# Patient Record
Sex: Male | Born: 1990 | Race: Black or African American | Hispanic: No | Marital: Single | Smoking: Current every day smoker
Health system: Southern US, Community
[De-identification: ages and names within clinical notes are randomized; demographics above are authoritative.]

---

## 2016-04-01 DIAGNOSIS — Y9301 Activity, walking, marching and hiking: Secondary | ICD-10-CM | POA: Insufficient documentation

## 2016-04-01 DIAGNOSIS — Y999 Unspecified external cause status: Secondary | ICD-10-CM | POA: Insufficient documentation

## 2016-04-01 DIAGNOSIS — S63266A Dislocation of metacarpophalangeal joint of right little finger, initial encounter: Secondary | ICD-10-CM | POA: Insufficient documentation

## 2016-04-01 DIAGNOSIS — Y929 Unspecified place or not applicable: Secondary | ICD-10-CM | POA: Insufficient documentation

## 2016-04-01 DIAGNOSIS — S63054A Dislocation of other carpometacarpal joint of right hand, initial encounter: Secondary | ICD-10-CM | POA: Insufficient documentation

## 2016-04-01 DIAGNOSIS — F17200 Nicotine dependence, unspecified, uncomplicated: Secondary | ICD-10-CM | POA: Insufficient documentation

## 2016-04-01 DIAGNOSIS — W1839XA Other fall on same level, initial encounter: Secondary | ICD-10-CM | POA: Insufficient documentation

## 2016-04-02 ENCOUNTER — Encounter (HOSPITAL_COMMUNITY): Payer: Self-pay | Admitting: Emergency Medicine

## 2016-04-02 ENCOUNTER — Emergency Department (HOSPITAL_COMMUNITY): Payer: Self-pay

## 2016-04-02 ENCOUNTER — Emergency Department (HOSPITAL_COMMUNITY)
Admission: EM | Admit: 2016-04-02 | Discharge: 2016-04-02 | Disposition: A | Discharge status: Home / Self Care | Payer: Self-pay | Attending: Emergency Medicine | Admitting: Emergency Medicine

## 2016-04-02 DIAGNOSIS — S63064A Dislocation of metacarpal (bone), proximal end of right hand, initial encounter: Secondary | ICD-10-CM

## 2016-04-02 MED ORDER — HYDROMORPHONE HCL 1 MG/ML IJ SOLN
1.0000 mg | Freq: Once | INTRAMUSCULAR | Status: AC
  Administered 2016-04-02: 1 mg via INTRAVENOUS
  Filled 2016-04-02: qty 1

## 2016-04-02 MED ORDER — IBUPROFEN 600 MG PO TABS: 600.0000 mg | ORAL_TABLET | Freq: Four times a day (QID) | ORAL | 0 refills | Status: AC | PRN

## 2016-04-02 MED ORDER — HYDROCODONE-ACETAMINOPHEN 5-325 MG PO TABS: 1.0000 | ORAL_TABLET | ORAL | 0 refills | Status: AC | PRN

## 2016-04-02 NOTE — ED Notes (Signed)
Providers in room procedure done

## 2016-04-02 NOTE — ED Triage Notes (Signed)
Pt states he fell while walking tonight, deformity to R hand noted, decreased cap refill to 4th and 5th fingers. Pt denies decreased sensation

## 2016-04-02 NOTE — Progress Notes (Signed)
Orthopedic Tech Progress Note Patient Details:  Alfonse SpruceDewayne Osgood [birth year] [id number]  Ortho Devices Type of Ortho Device: Ace wrap, Ulna gutter splint Ortho Device/Splint Location: Well padded plaster ulnar gutter splint Rt arm Ortho Device/Splint Interventions: Application, Minda MeoOrdered   Rola Lennon S Iam Lipson 04/02/2016, 4:58 AM

## 2016-04-02 NOTE — ED Provider Notes (Signed)
[facility]-EMERGENCY DEPT Provider Note   CSN: [id number] Arrival date & time: 04/01/16  2357  By signing my name below, I, Aggie MoatsJenny Song, attest that this documentation has been prepared under the direction and in the presence of Persephone Schriever A Makilah Dowda PA-C. Electronically signed by: Aggie MoatsJenny Song, ED Scribe. 04/02/16. 1:28 AM.  History   Chief Complaint Chief Complaint  Patient presents with  . Hand Injury   The history is provided by the patient. No language interpreter was used.   HPI Comments:  Jesse Weber is a 25 y.o. male who presents to the Emergency Department complaining of right hand injury, which occurred 2 hours ago. Pt reports that he fell onto his right hand while walking. Associated symptoms include pain to 4th and 5th fingers and swelling. He has not taken any medication prior to arrival. Denies decreased sensations or numbness. No pertinent past medical history or allergies.  History reviewed. No pertinent past medical history.  There are no active problems to display for this patient.   History reviewed. No pertinent surgical history.     Home Medications    Prior to Admission medications   Not on File    Family History History reviewed. No pertinent family history.  Social History Social History  Substance Use Topics  . Smoking status: Current Every Day Smoker  . Smokeless tobacco: Never Used  . Alcohol use Yes     Allergies   Review of patient's allergies indicates not on file.   Review of Systems Review of Systems  Musculoskeletal: Positive for arthralgias, joint swelling and myalgias.  Neurological: Negative for numbness.     Physical Exam Updated Vital Signs BP 139/79 (BP Location: Left Arm)   Pulse 92   Temp 98.6 F (37 C) (Oral)   Resp 12   SpO2 100%   Physical Exam  Constitutional: He is oriented to person, place, and time. He appears well-developed and well-nourished.  HENT:  Head: Normocephalic.  Eyes: EOM are normal.  Neck: Normal  range of motion.  Cardiovascular: Normal rate.   Pulmonary/Chest: Effort normal.  Musculoskeletal: Normal range of motion.  Right hand bony deformity of proximal dorsum of hand at 4th and 5th MCs. Closed injury. FROM all digits. Cap RF <2s distal to injury. Distal forearm non-tender.   Neurological: He is alert and oriented to person, place, and time.  Skin: Skin is warm and dry.  Psychiatric: He has a normal mood and affect.  Nursing note and vitals reviewed.    ED Treatments / Results  DIAGNOSTIC STUDIES:  Oxygen Saturation is 100% on room air, normal by my interpretation.    COORDINATION OF CARE:  1:25 AM Discussed treatment plan with pt at bedside, which includes IV pain medication and possible relocation, and pt agreed to plan.  Labs (all labs ordered are listed, but only abnormal results are displayed) Labs Reviewed - No data to display  EKG  EKG Interpretation None       Radiology No results found.  Procedures Procedures (including critical care time) Reduction of dislocated 5th carpometacarpal joint performed by Dr. Jeraldine LootsLockwood. Medications Ordered in ED Medications - No data to display   Initial Impression / Assessment and Plan / ED Course  I have reviewed the triage vital signs and the nursing notes.  Pertinent labs & imaging results that were available during my care of the patient were reviewed by me and considered in my medical decision making (see chart for details).  Clinical Course  Value Comment By Time  DG Hand Complete Right (Reviewed) Gerhard Munch, MD 09/02 0325    1. Carpometacarpal dislocation (5th right), successful reduction  Right hand dominant patient with dislocation injury of right hand and successful reduction. Refer to hand for follow up re-evaluation.   Final Clinical Impressions(s) / ED Diagnoses   Final diagnoses:  None    New Prescriptions New Prescriptions   No medications on file  I personally performed the services  described in this documentation, which was scribed in my presence. The recorded information has been reviewed and is accurate.      Elpidio Anis, PA-C 04/07/16 2142    Gerhard Munch, MD 04/18/16 [phone]

## 2016-04-02 NOTE — ED Notes (Signed)
PA at bedside.

## 2016-04-02 NOTE — ED Notes (Signed)
Pt transported to XR.  

## 2016-04-04 ENCOUNTER — Telehealth: Payer: Self-pay | Admitting: *Deleted

## 2016-07-20 ENCOUNTER — Emergency Department (HOSPITAL_COMMUNITY)
Admission: EM | Admit: 2016-07-20 | Discharge: 2016-07-21 | Disposition: A | Discharge status: Home / Self Care | Payer: Self-pay | Attending: Emergency Medicine | Admitting: Emergency Medicine

## 2016-07-20 ENCOUNTER — Encounter (HOSPITAL_COMMUNITY): Payer: Self-pay

## 2016-07-20 DIAGNOSIS — Z79899 Other long term (current) drug therapy: Secondary | ICD-10-CM | POA: Insufficient documentation

## 2016-07-20 DIAGNOSIS — H1031 Unspecified acute conjunctivitis, right eye: Secondary | ICD-10-CM

## 2016-07-20 DIAGNOSIS — H1089 Other conjunctivitis: Secondary | ICD-10-CM | POA: Insufficient documentation

## 2016-07-20 DIAGNOSIS — F17200 Nicotine dependence, unspecified, uncomplicated: Secondary | ICD-10-CM | POA: Insufficient documentation

## 2016-07-20 MED ORDER — TETRACAINE HCL 0.5 % OP SOLN
2.0000 [drp] | Freq: Once | OPHTHALMIC | Status: DC
  Filled 2016-07-20: qty 4

## 2016-07-20 MED ORDER — TETRACAINE HCL 0.5 % OP SOLN
2.0000 [drp] | Freq: Once | OPHTHALMIC | Status: AC
  Administered 2016-07-20: 2 [drp] via OPHTHALMIC

## 2016-07-20 MED ORDER — TETRACAINE HCL 0.5 % OP SOLN: 2.0000 [drp] | Freq: Once | OPHTHALMIC | Status: DC

## 2016-07-20 MED ORDER — POLYMYXIN B-TRIMETHOPRIM 10000-0.1 UNIT/ML-% OP SOLN: 1.0000 [drp] | OPHTHALMIC | 0 refills | Status: AC

## 2016-07-20 MED ORDER — FLUORESCEIN SODIUM 0.6 MG OP STRP
ORAL_STRIP | OPHTHALMIC | Status: AC
  Filled 2016-07-20: qty 1

## 2016-07-20 MED ORDER — FLUORESCEIN SODIUM 0.6 MG OP STRP: 1.0000 | ORAL_STRIP | Freq: Once | OPHTHALMIC | Status: DC

## 2016-07-20 NOTE — Discharge Instructions (Signed)
Your symptoms are most consistent with bacterial conjunctivitis.  You will be treated with antibiotic eye drops, please see attached prescription and apply drops to BOTH eyes as instructed.   Please read attached information on "bacterial conjunctivitis"  You have been given a work note, please see attached.   [facility] to establish care with a primary care provider.  This clinic takes patients without health insurance.   Return to emergency department if you experience changes in vision, fevers, swelling and redness in skin around your eye

## 2016-07-20 NOTE — ED Triage Notes (Signed)
Pt states that his R eye began getting red and itchy on Monday. Also states that he has experienced green drainage from same eye. Denies N/V/F. States that someone in his household just got over pink eye. A&Ox4.

## 2016-07-21 NOTE — ED Notes (Signed)
Patient d/c'd self care.  F/U and medications reviewed.  Patient verbalized understanding. 

## 2016-07-21 NOTE — ED Provider Notes (Signed)
[facility]-EMER[facility]ENCY DEPT Provider Note   CSN: [id number] Arrival date & time: 07/20/16  2147     History   Chief Complaint Chief Complaint  Patient presents with  . Conjunctivitis    HPI Jesse Weber is a 25 y.o. male presents with R eye pain, redness, green/yellow discharge x 3 days.  Pt denies contact lenses, trauma, changes in vision, pain with eye movements, fevers.  Pt states a relative was diagnosed with pink eye last week.  Pt has used relative's erythromycin ointment in his eye without relief.   HPI  History reviewed. No pertinent past medical history.  There are no active problems to display for this patient.   History reviewed. No pertinent surgical history.     Home Medications    Prior to Admission medications   Medication Sig Start Date End Date Taking? Authorizing Provider  HYDROcodone-acetaminophen (NORCO/VICODIN) 5-325 MG tablet Take 1-2 tablets by mouth every 4 (four) hours as needed. 04/02/16   Elpidio AnisShari Upstill, PA-C  ibuprofen (ADVIL,MOTRIN) 600 MG tablet Take 1 tablet (600 mg total) by mouth every 6 (six) hours as needed. 04/02/16   Elpidio AnisShari Upstill, PA-C  trimethoprim-polymyxin b (POLYTRIM) ophthalmic solution Place 1 drop into both eyes every 4 (four) hours. 07/20/16   Liberty Handylaudia J Gibbons, PA-C    Family History History reviewed. No pertinent family history.  Social History Social History  Substance Use Topics  . Smoking status: Current Every Day Smoker  . Smokeless tobacco: Never Used  . Alcohol use Yes     Allergies   Patient has no known allergies.   Review of Systems Review of Systems  Constitutional: Negative for chills and fever.  HENT: Negative for congestion, ear pain and sore throat.   Eyes: Positive for pain, discharge and redness. Negative for photophobia, itching and visual disturbance.  Respiratory: Negative for cough and shortness of breath.   Cardiovascular: Negative for chest pain and palpitations.  Gastrointestinal: Negative for  abdominal pain, nausea and vomiting.  Genitourinary: Negative for difficulty urinating.  Musculoskeletal: Negative for back pain.  Skin: Negative for color change and rash.  Neurological: Negative for headaches.  All other systems reviewed and are negative.    Physical Exam Updated Vital Signs BP 126/79 (BP Location: Left Arm)   Pulse 76   Temp 98.2 F (36.8 C) (Oral)   Resp 17   Ht 5\' 10"  (1.778 m)   SpO2 99%   Physical Exam  Constitutional: He is oriented to person, place, and time. Vital signs are normal. He appears well-developed and well-nourished.  HENT:  Head: Normocephalic and atraumatic.  TMs pearly gray without bulging or erythema. Oropharynx clear, moist without exudates.    Eyes:  R conjunctival injection with yellow discharge. No eyelash matting.  No chalazion or hordeolum visualized.  Fluorescein dye test negative for corneal abrasions in R eye. PERRL.  EOM normal and symmetric bilaterally without tenderness. No periorbital soft tissue edema, erythema or tenderness.   Neck: Normal range of motion. Neck supple.  Cardiovascular: Normal rate and regular rhythm.   Pulmonary/Chest: Effort normal and breath sounds normal.  Musculoskeletal: Normal range of motion.  Lymphadenopathy:    He has no cervical adenopathy.  Neurological: He is alert and oriented to person, place, and time.  Skin: Skin is warm and dry.  Psychiatric: He has a normal mood and affect. His behavior is normal.  Nursing note and vitals reviewed.   ED Treatments / Results  Labs (all labs ordered are listed, but only abnormal results  are displayed) Labs Reviewed - No data to display  EKG  EKG Interpretation None       Radiology No results found.  Procedures Procedures (including critical care time)  Medications Ordered in ED Medications  tetracaine (PONTOCAINE) 0.5 % ophthalmic solution 2 drop (2 drops Right Eye Given by Other 07/20/16 2353)     Initial Impression / Assessment and  Plan / ED Course  I have reviewed the triage vital signs and the nursing notes.  Pertinent labs & imaging results that were available during my care of the patient were reviewed by me and considered in my medical decision making (see chart for details).  Clinical Course    25 yo male with symptoms, relative diagnosed with pink eye last week, and eye exam findings most c/w bacterial conjunctivitis.  No abrasion noted on fluorescein dye test.  Doubt other eye emergency including acute angle glaucoma, periorbital cellulitis, iritis.  Pt instructed to stop using relative's antibiotic ointment.  Pt will be discharged with antibiotic eye drops and tylenol/ibuprofen for pain.  No need for ophtho referral at this point.  Strict ED return instructions given, pt verbalized understanding and agreeable to dispo plan.    Final Clinical Impressions(s) / ED Diagnoses   Final diagnoses:  Acute bacterial conjunctivitis of right eye    New Prescriptions Discharge Medication List as of 07/20/2016 11:44 PM    START taking these medications   Details  trimethoprim-polymyxin b (POLYTRIM) ophthalmic solution Place 1 drop into both eyes every 4 (four) hours., Starting Wed 07/20/2016, Print         Liberty HandyClaudia J Gibbons, PA-C 07/21/16 1127    Melene Planan Floyd, DO 07/21/16 1611

## 2016-07-22 DIAGNOSIS — Z5321 Procedure and treatment not carried out due to patient leaving prior to being seen by health care provider: Secondary | ICD-10-CM | POA: Insufficient documentation

## 2016-07-22 DIAGNOSIS — F17200 Nicotine dependence, unspecified, uncomplicated: Secondary | ICD-10-CM | POA: Insufficient documentation

## 2016-07-22 DIAGNOSIS — H5710 Ocular pain, unspecified eye: Secondary | ICD-10-CM | POA: Insufficient documentation

## 2016-07-23 ENCOUNTER — Emergency Department (HOSPITAL_COMMUNITY)
Admission: EM | Admit: 2016-07-23 | Discharge: 2016-07-23 | Disposition: A | Discharge status: Home / Self Care | Payer: Medicaid Other | Attending: Dermatology | Admitting: Dermatology

## 2016-07-23 NOTE — ED Notes (Signed)
Patient did not answer.  

## 2016-07-23 NOTE — ED Triage Notes (Signed)
Patient was here two days ago for pink eye. Patient still feels like it is still something in his eye. His states that it is painful.

## 2017-04-23 IMAGING — CR DG HAND COMPLETE 3+V*R*
3 series · 3 of 3 positions shown · non-contrast
Comparison: None.

CLINICAL DATA: Initial evaluation for acute trauma, fall. Pain at
fourth and fifth metacarpals.

EXAM:
RIGHT HAND - COMPLETE 3+ VIEW

[x hand pa right]
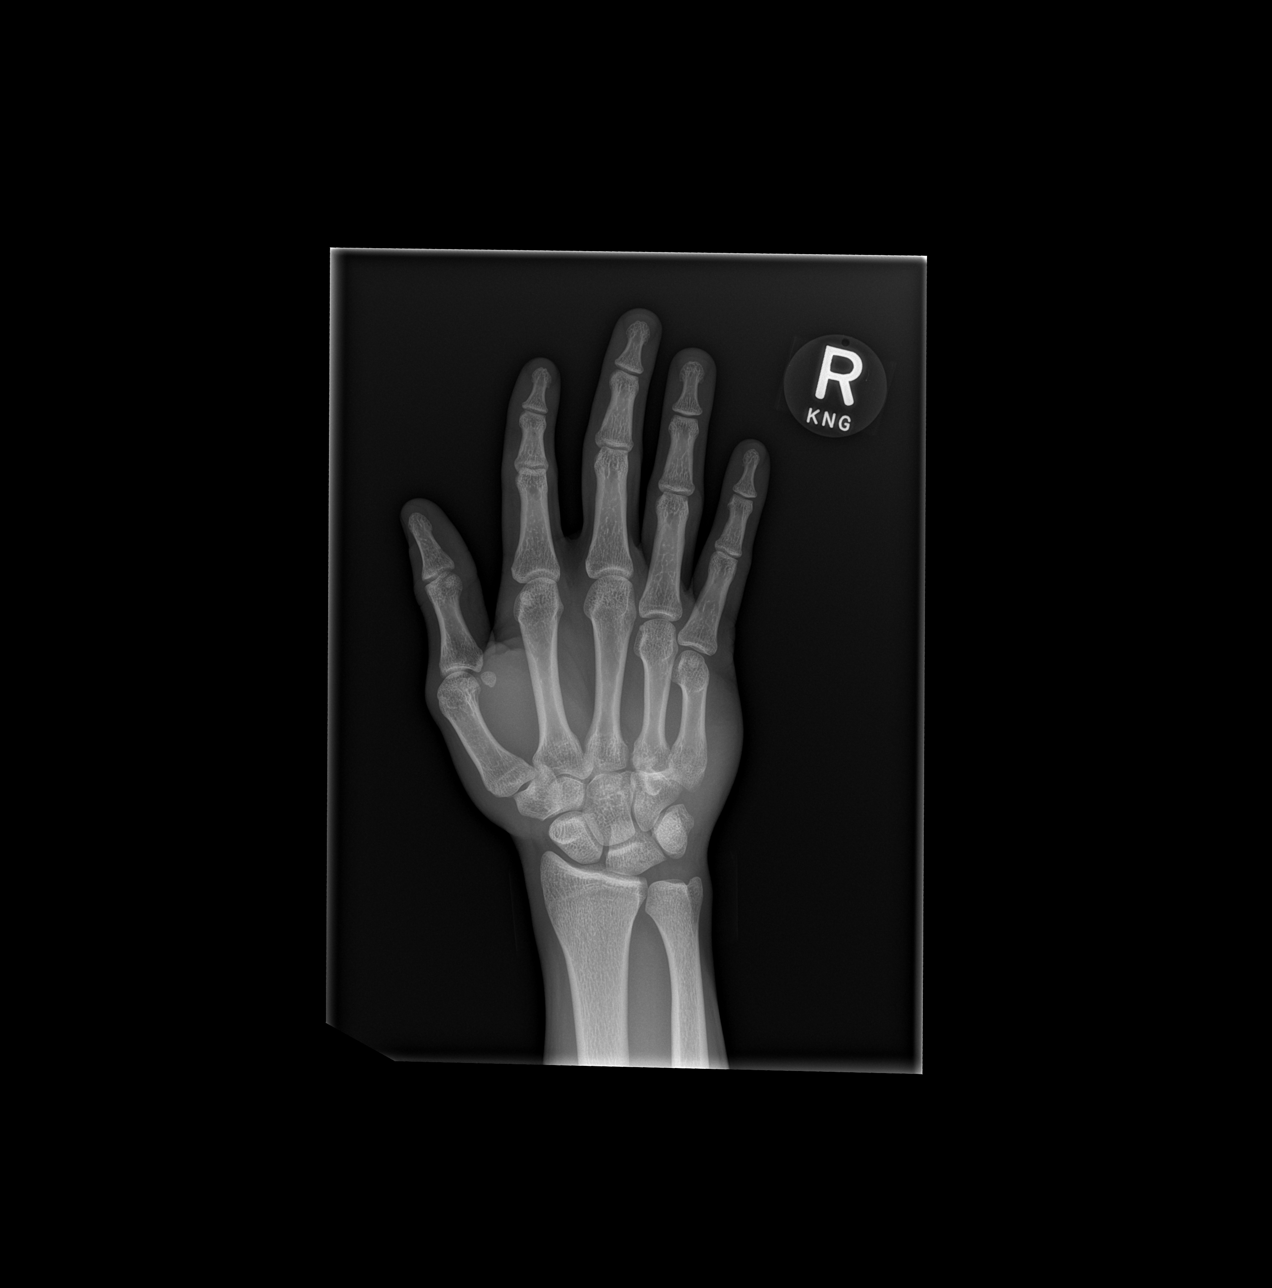

[x hand obl right]
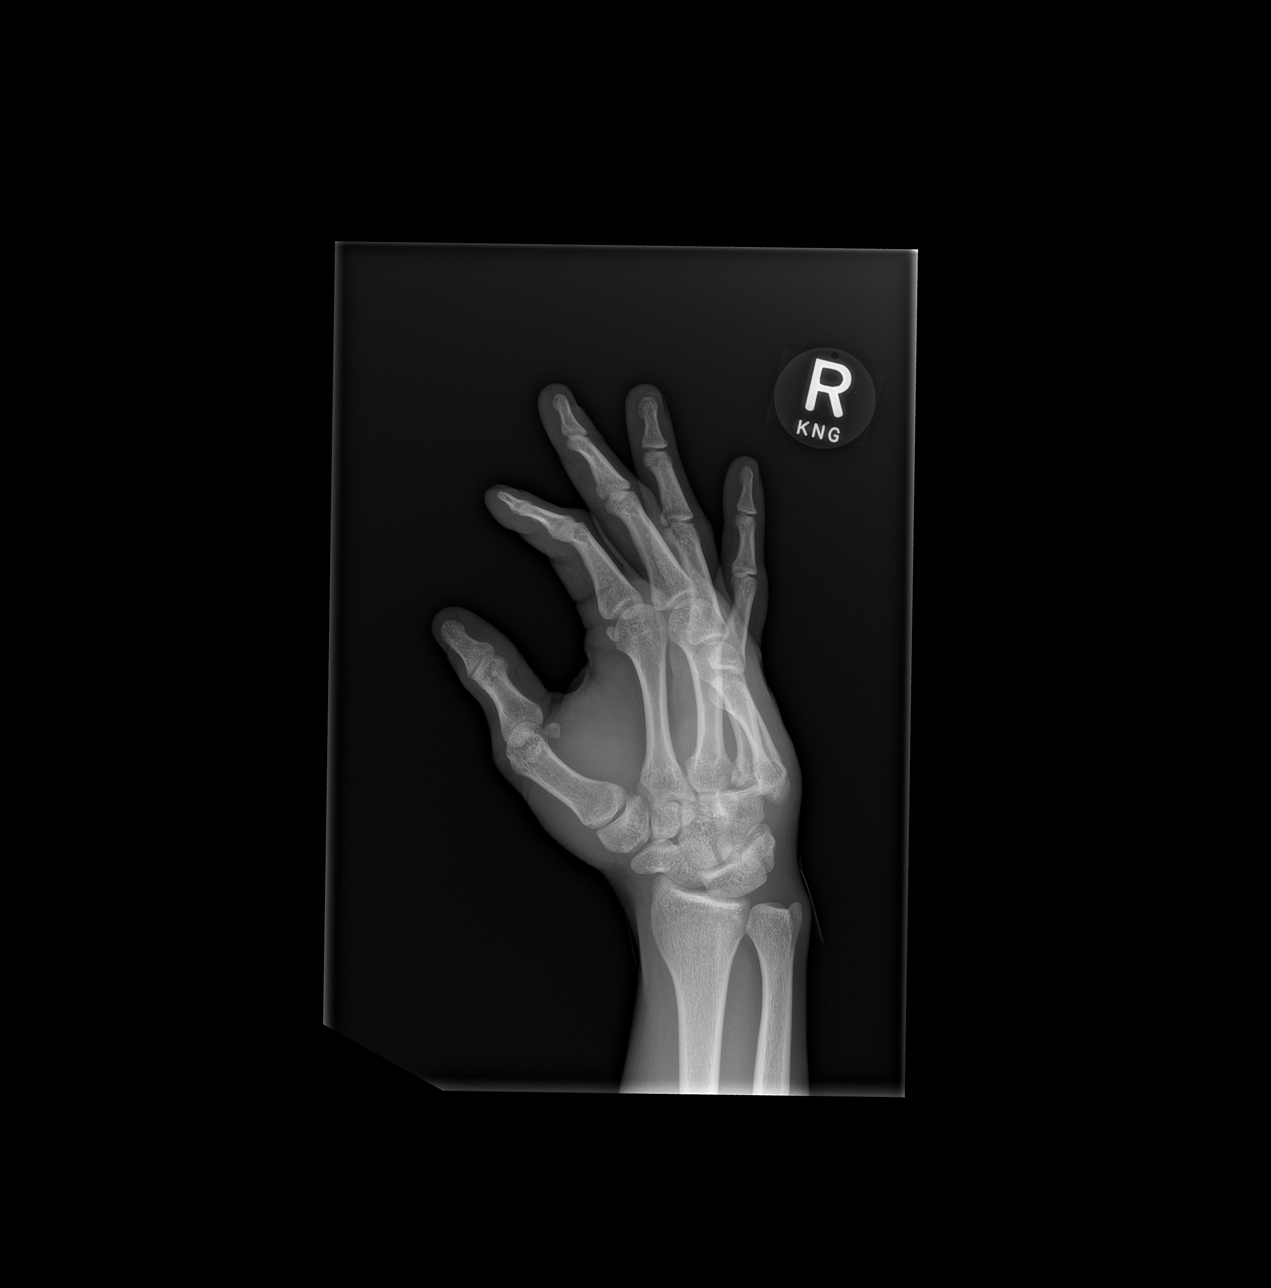

[x hand lat right]
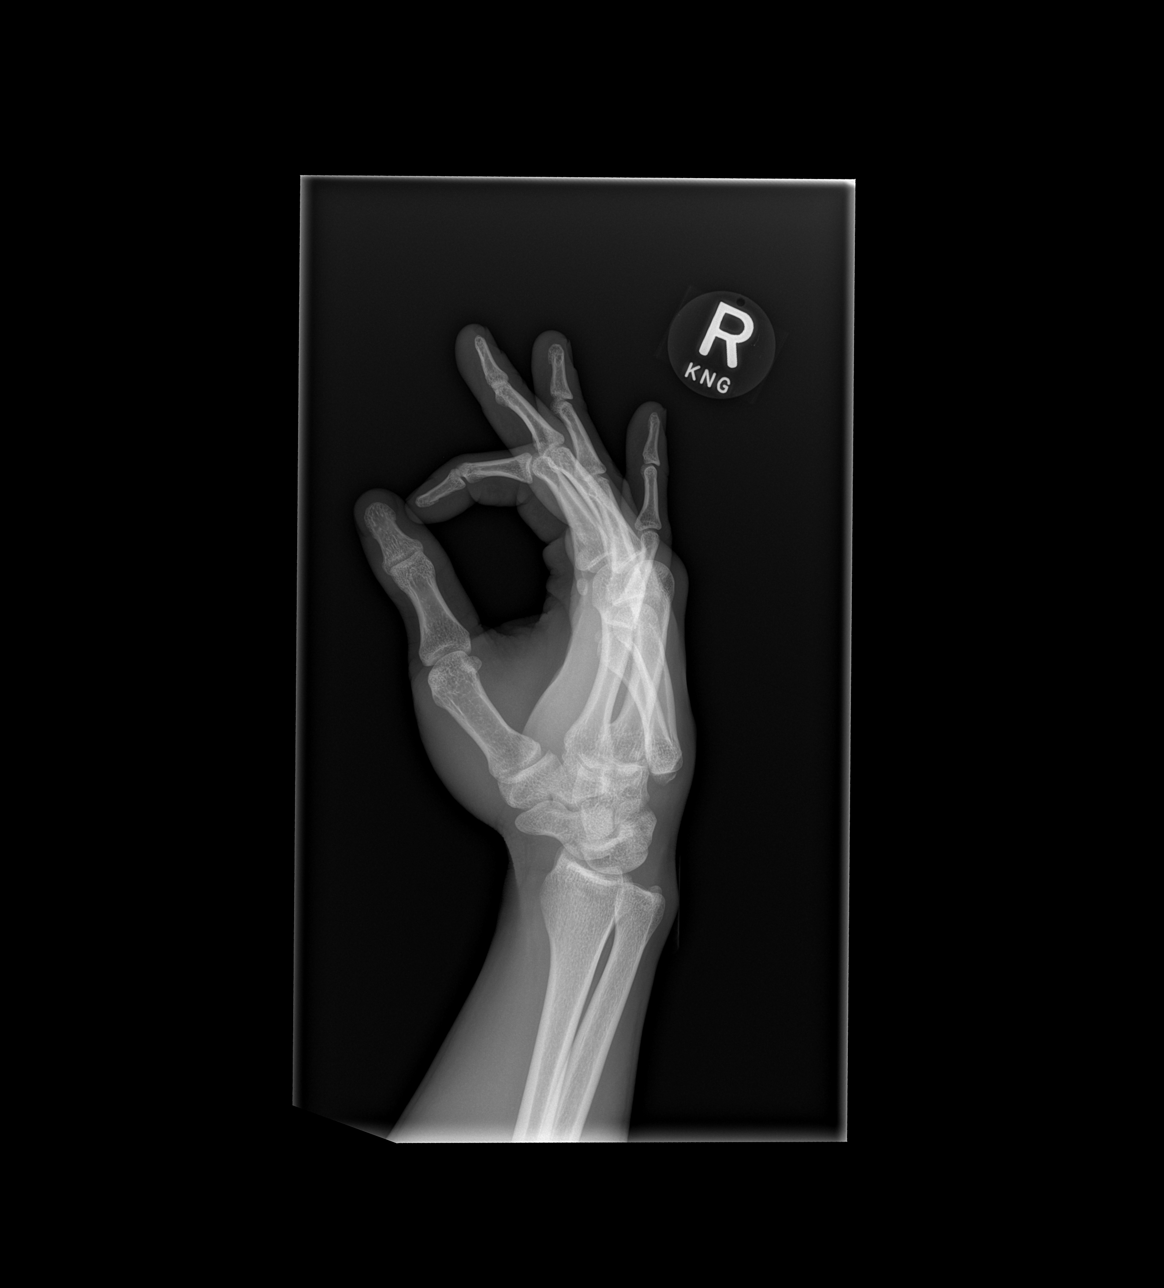

[3 of 3 positions shown; findings below may reference images not displayed]

FINDINGS: There is acute dislocation of the head metacarpals at base, at the
carpometacarpal articulation, best appreciated on lateral and
oblique views. Possible subluxation/dislocation of the base of the
fourth metacarpal as well. The fourth and fifth metacarpals appears
somewhat foreshortened on AP view. No associated fracture
identified. No other acute osseous abnormality about the hand. Soft
tissue swelling overlies the fourth and fifth metacarpals. No
radiopaque foreign body.
IMPRESSION: 1. Acute dislocation of the base of the fifth metacarpal at the the
[place]ouard articulation. Possible subluxation/ dislocation at
the base of the fourth metacarpal as well. No associated fracture.
2. Soft tissue swelling at the ulnar aspect of the hand.
